# Patient Record
Sex: Female | Born: 2005 | Race: Black or African American | Hispanic: No | Marital: Single | Smoking: Never smoker
Health system: Southern US, Community
[De-identification: ages and names within clinical notes are randomized; demographics above are authoritative.]

---
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
## 2024-04-02 ENCOUNTER — Ambulatory Visit (INDEPENDENT_AMBULATORY_CARE_PROVIDER_SITE_OTHER): Admitting: Physician Assistant

## 2024-04-02 ENCOUNTER — Encounter: Payer: Self-pay | Admitting: Physician Assistant

## 2024-04-02 VITALS — BP 98/70 | HR 83 | Temp 98.1°F | Ht 60.5 in | Wt 117.5 lb

## 2024-04-02 DIAGNOSIS — G8929 Other chronic pain: Secondary | ICD-10-CM

## 2024-04-02 DIAGNOSIS — M25561 Pain in right knee: Secondary | ICD-10-CM

## 2024-04-02 DIAGNOSIS — Z3009 Encounter for other general counseling and advice on contraception: Secondary | ICD-10-CM

## 2024-04-02 MED ORDER — VIENVA 0.1-20 MG-MCG PO TABS: 1.0000 | ORAL_TABLET | Freq: Every day | ORAL | 11 refills | Status: AC

## 2024-04-02 NOTE — Patient Instructions (Signed)
 Welcome to [facility]! It was a pleasure meeting you today.   PLEASE NOTE:  If you had any LAB tests please let us know if you have not heard back within a few days. You may see your results on MyChart before we have a chance to review them but we will give you a call once they are reviewed by [place]. If we ordered any REFERRALS today, please let us know if you have not heard from their office within the next two weeks. Let us know through MyChart if you are needing REFILLS, or have your pharmacy send [place] the request. You can also use MyChart to communicate with me or any office staff.  Please try these tips to maintain a healthy lifestyle:  Eat most of your calories during the day when you are active. Eliminate processed foods including packaged sweets (pies, cakes, cookies), reduce intake of potatoes, white bread, white pasta, and white rice. Look for whole grain options, oat flour or almond flour.  Each meal should contain half fruits/vegetables, one quarter protein, and one quarter carbs (no bigger than a computer mouse).  Cut down on sweet beverages. This includes juice, soda, and sweet tea. Also watch fruit intake, though this is a healthier sweet option, it still contains natural sugar! Limit to 3 servings daily.  Drink at least 1 glass of water with each meal and aim for at least 8 glasses (64 ounces) per day.  Exercise at least 150 minutes every week to the best of your ability.    Take Care,  Audel Coakley, PA-C

## 2024-04-02 NOTE — Progress Notes (Signed)
 Patient ID: Maria Ayers, female    DOB: 04/16/2006, 18 y.o.   MRN: [id number]   Assessment & Plan:  Chronic pain of right knee -     Ambulatory referral to Physical Therapy  Counseling for birth control, oral contraceptives  Other orders -     Vienva; Take 1 tablet by mouth daily.  Dispense: 28 tablet; Refill: 11     Assessment & Plan Chronic Right Knee Pain Chronic right knee pain since February, following a hamstring injury. Pain is intermittent, occurring at least twice a week, affecting most of the knee. MRI showed no abnormalities. No swelling or pain on palpation. Emphasized the need for physical therapy to improve knee strength and function before returning to athletic activities in [place] . Discussed the importance of taking anti-inflammatory medication with food to minimize gastrointestinal side effects. - Refer to physical therapy with urgent status to ensure sessions before returning to [place]  on August 28th - Advise taking anti-inflammatory medication with food to reduce inflammation - Consider corticosteroid injections if pain persists  Menstrual Irregularities Irregular menstrual cycles with either prolonged bleeding or amenorrhea. Currently managed with birth control for regulation. Discussed prescribing a one-year supply to ensure continuity of care and prevent issues with prescription changes experienced in [place] . - Prescribe a one-year supply of current birth control to [facility] on [address]      Return in about 1 year (around 04/02/2025).    Subjective:    Chief Complaint  Patient presents with   Establish Care   Knee Pain    Pt c/o right knee pain since May    HPI Discussed the use of AI scribe software for clinical note transcription with the patient, who gave verbal consent to proceed.  History of Present Illness Maria Ayers is a 18 year old female who presents with chronic right knee pain.  She has been experiencing chronic  right knee pain that began prior to a hamstring injury in February 2025. The pain intensified after recovering from the hamstring injury and has persisted since then. It is less severe since she stopped running but still occurs intermittently, sometimes while standing. Initially, the pain was located at the back of the knee and has since spread to other areas of the knee.  In May 2025, she consulted a healthcare provider at [facility], where an MRI was performed, showing no abnormalities. She was advised to start physical therapy and take anti-inflammatory medication, though she has not yet begun the medication. She was also informed that if the pain persists, she might need injections in the knee.  She experiences knee pain approximately twice a week, with varying frequency, and describes the pain as affecting most of the knee area.  She is currently on birth control for irregular and heavy menstrual periods, which she has experienced since menarche. Her periods were either prolonged or absent for months, prompting her pediatric doctor to recommend birth control.  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 She competed from mid-March to the end of April 2025 after a brief recovery from her hamstring injury. She does not smoke, drink, or engage in sexual activity. Her family, including her parents and three sisters, reside in town.     No past medical history on file.  Past Surgical History:  Procedure Laterality Date   WISDOM TOOTH EXTRACTION Bilateral 06/2022    Family History  Problem Relation Age of Onset  Colon cancer Maternal Grandmother    Drug abuse Paternal Grandmother    Alcohol abuse Paternal Grandmother    Stroke Paternal Grandfather     Social History   Tobacco Use   Smoking status: Never   Smokeless tobacco: Never  Vaping Use   Vaping status: Former  Substance Use Topics   Alcohol use: Yes    Comment:  Socially   Drug use: Never     No Known Allergies  Review of Systems NEGATIVE UNLESS OTHERWISE INDICATED IN HPI      Objective:     BP 98/70 (BP Location: Left Arm, Patient Position: Sitting, Cuff Size: Normal)   Pulse 83   Temp 98.1 F (36.7 C) (Temporal)   Ht 5' 0.5 (1.537 m)   Wt 117 lb 8 oz (53.3 kg)   SpO2 98%   BMI 22.57 kg/m   Wt Readings from Last 3 Encounters:  04/02/24 117 lb 8 oz (53.3 kg) (31%, Z= -0.49)*  07/13/21 116 lb 6.5 oz (52.8 kg) (43%, Z= -0.19)*  08/27/19 119 lb 14.9 oz (54.4 kg) (64%, Z= 0.35)*   * Growth percentiles are based on CDC (Girls, 2-20 Years) data.    BP Readings from Last 3 Encounters:  04/02/24 98/70  07/14/21 113/69  08/27/19 (!) 119/61     Physical Exam Vitals and nursing note reviewed.  Constitutional:      General: She is not in acute distress.    Appearance: Normal appearance. She is not ill-appearing.  HENT:     Head: Normocephalic.     Right Ear: External ear normal.     Left Ear: External ear normal.     Nose: No congestion.     Mouth/Throat:     Mouth: Mucous membranes are moist.     Pharynx: No oropharyngeal exudate or posterior oropharyngeal erythema.  Eyes:     Extraocular Movements: Extraocular movements intact.     Conjunctiva/sclera: Conjunctivae normal.     Pupils: Pupils are equal, round, and reactive to light.  Cardiovascular:     Rate and Rhythm: Normal rate and regular rhythm.     Pulses: Normal pulses.     Heart sounds: Normal heart sounds. No murmur heard. Pulmonary:     Effort: Pulmonary effort is normal. No respiratory distress.     Breath sounds: Normal breath sounds. No wheezing.  Musculoskeletal:        General: No swelling, tenderness or deformity. Normal range of motion.     Cervical back: Normal range of motion.     Right lower leg: No edema.     Left lower leg: No edema.  Skin:    General: Skin is warm.  Neurological:     Mental Status: She is alert and oriented to person, place,  and time.  Psychiatric:        Mood and Affect: Mood normal.        Behavior: Behavior normal.             Hurshell Dino M Xhaiden Coombs, PA-C

## 2024-04-29 ENCOUNTER — Encounter: Payer: Self-pay | Admitting: Physician Assistant

## 2024-04-29 ENCOUNTER — Telehealth: Payer: Self-pay | Admitting: Physician Assistant

## 2024-04-29 NOTE — Telephone Encounter (Signed)
 LVM for patient advising provider recommendations and unable to complete this form due to injury.  Patient will need to schedule with Sports medicine for Sports physical and completion of form needed per PCP. Please advise patient upon returned call

## 2024-04-29 NOTE — Telephone Encounter (Signed)
 Patient dropped off document physical form, to be filled out by provider. Patient requested to send it back via Call Patient to pick up within ASAP. Document is located in providers tray at front office.Please advise at Mobile [phone] (mobile). PATIENT PREFERS THIS BE DONE BY 05/02/24 IF AT ALL POSSIBLE.

## 2024-04-29 NOTE — Telephone Encounter (Signed)
 Please see msg, pt dropped off form but no CPE on file for this patient. Only seen in office once

## 2024-04-30 NOTE — Telephone Encounter (Signed)
Please see pt concern and advise
